# Patient Record
Sex: Male | Born: 2000 | Race: White | Hispanic: No | Marital: Single | Smoking: Never smoker
Health system: Southern US, Community
[De-identification: ages and names within clinical notes are randomized; demographics above are authoritative.]

## PROBLEM LIST (undated history)

## (undated) DIAGNOSIS — K219 Gastro-esophageal reflux disease without esophagitis: Secondary | ICD-10-CM

## (undated) DIAGNOSIS — J45909 Unspecified asthma, uncomplicated: Secondary | ICD-10-CM

## (undated) HISTORY — PX: FRACTURE SURGERY: SHX138

## (undated) HISTORY — PX: ORIF ANKLE FRACTURE: SUR919

## (undated) HISTORY — PX: MYRINGOTOMY: SUR874

---
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
## 2018-04-06 ENCOUNTER — Ambulatory Visit (INDEPENDENT_AMBULATORY_CARE_PROVIDER_SITE_OTHER): Payer: BLUE CROSS/BLUE SHIELD | Admitting: Family Medicine

## 2018-04-06 ENCOUNTER — Encounter: Payer: Self-pay | Admitting: Family Medicine

## 2018-04-06 VITALS — BP 100/64 | HR 57 | Temp 98.4°F | Ht 68.5 in | Wt 199.2 lb

## 2018-04-06 DIAGNOSIS — Z0000 Encounter for general adult medical examination without abnormal findings: Secondary | ICD-10-CM

## 2018-04-06 DIAGNOSIS — R5383 Other fatigue: Secondary | ICD-10-CM | POA: Diagnosis not present

## 2018-04-06 LAB — HEPATIC FUNCTION PANEL
ALK PHOS: 60 U/L (ref 52–171)
ALT: 32 U/L (ref 0–53)
AST: 30 U/L (ref 0–37)
Albumin: 4.5 g/dL (ref 3.5–5.2)
BILIRUBIN DIRECT: 0.2 mg/dL (ref 0.0–0.3)
TOTAL PROTEIN: 6.8 g/dL (ref 6.0–8.3)
Total Bilirubin: 0.9 mg/dL (ref 0.2–1.2)

## 2018-04-06 LAB — CBC WITH DIFFERENTIAL/PLATELET
BASOS ABS: 0 10*3/uL (ref 0.0–0.1)
BASOS PCT: 0.5 % (ref 0.0–3.0)
EOS ABS: 0.1 10*3/uL (ref 0.0–0.7)
Eosinophils Relative: 0.7 % (ref 0.0–5.0)
HEMATOCRIT: 42.9 % (ref 36.0–49.0)
Hemoglobin: 15 g/dL (ref 12.0–16.0)
LYMPHS PCT: 26 % (ref 24.0–48.0)
Lymphs Abs: 2.3 10*3/uL (ref 0.7–4.0)
MCHC: 35 g/dL (ref 31.0–37.0)
MCV: 92.8 fl (ref 78.0–98.0)
MONO ABS: 0.4 10*3/uL (ref 0.1–1.0)
Monocytes Relative: 4.6 % (ref 3.0–12.0)
Neutro Abs: 5.9 10*3/uL (ref 1.4–7.7)
Neutrophils Relative %: 68.2 % (ref 43.0–71.0)
Platelets: 220 10*3/uL (ref 150.0–575.0)
RBC: 4.63 Mil/uL (ref 3.80–5.70)
RDW: 12.4 % (ref 11.4–15.5)
WBC: 8.7 10*3/uL (ref 4.5–13.5)

## 2018-04-06 LAB — BASIC METABOLIC PANEL
BUN: 20 mg/dL (ref 6–23)
CO2: 29 mEq/L (ref 19–32)
CREATININE: 0.86 mg/dL (ref 0.40–1.50)
Calcium: 9.6 mg/dL (ref 8.4–10.5)
Chloride: 105 mEq/L (ref 96–112)
GFR: 121.62 mL/min (ref >60.00)
Glucose, Bld: 94 mg/dL (ref 70–99)
Potassium: 3.8 mEq/L (ref 3.5–5.1)
Sodium: 140 mEq/L (ref 135–145)

## 2018-04-06 LAB — LIPID PANEL
CHOL/HDL RATIO: 2
Cholesterol: 141 mg/dL (ref 0–200)
HDL: 58.7 mg/dL (ref >39.00)
LDL CALC: 75 mg/dL (ref 0–99)
NONHDL: 82.05
Triglycerides: 33 mg/dL (ref 0.0–149.0)
VLDL: 6.6 mg/dL (ref 0.0–40.0)

## 2018-04-06 LAB — TSH: TSH: 2.56 u[IU]/mL (ref 0.40–5.00)

## 2018-04-06 NOTE — Progress Notes (Signed)
Dr. Frederico Hamman T. Marcellina Jonsson, MD, [facility] Primary [facility] and Sports Medicine [place], [address] Phone: [phone] Fax: [phone]  04/06/2018  Patient: Ronald Estes, MRN: [id number], DOB: 09-07-2000, 17 y.o.  Primary Physician:  Owens Loffler, MD   Chief Complaint  Patient presents with  . Annual Exam   Subjective:   Ronald Estes is a 17 y.o. pleasant patient who presents with the following:  Preventative Health Maintenance Visit:  Health Maintenance Summary Reviewed and updated, unless pt declines services.  Tobacco History Reviewed. Alcohol: No concerns, no excessive use Exercise Habits: daily, high activity STD concerns: no risk or activity to increase risk Drug Use: None Encouraged self-testicular check  HPV vaccine #3  Down from 330 - 199 He and brother working out all the time. Wants to get down to 170.  Ketogenic diet.  Still some veggies.   Did want to do keep doing juul - trying to cut down  Health Maintenance  Topic Date Due  . HIV Screening  02/24/2014  . INFLUENZA VACCINE  03/31/2018  . TETANUS/TDAP  10/18/2019   Immunization History  Administered Date(s) Administered  . DTaP 05/08/1999, 09/10/1999, 10/27/1999, 05/31/2000, 04/10/2003  . HPV 9-valent 12/28/2016, 03/02/2017  . Hepatitis A 07/07/2006, 01/17/2007  . Hepatitis B 06/19/99, 04/04/1999, 12/08/1999  . HiB (PRP-OMP) 05/08/1999, 09/10/1999, 10/27/1999, 05/31/2000  . IPV 12/08/1999, 05/31/2000, 08/30/2000, 04/10/2003  . Influenza-Unspecified 09/04/2010, 08/21/2011, 07/19/2012  . MMR 03/04/2000, 04/10/2003  . Meningococcal B, OMV 12/28/2016  . Meningococcal Conjugate 10/17/2009, 12/28/2016  . Pneumococcal Conjugate-13 05/08/1999, 09/10/1999, 10/27/1999, 03/04/2000  . Tdap 10/17/2009  . Varicella 03/04/2000, 09/04/2010   There are no active problems to display for this patient.  Past Medical History:  Diagnosis Date  . Asthma   . GERD  (gastroesophageal reflux disease)   . Morbid obesity with body mass index of 40.0-49.9 ([place] [place]) 12/01/2013  . Obesity    Past Surgical History:  Procedure Laterality Date  . FRACTURE SURGERY    . HARDWARE REMOVAL  02/19/2012   Procedure: HARDWARE REMOVAL;  Surgeon: Alta Corning, MD;  Location: [facility];  Service: Orthopedics;  Laterality: Right;  right ankle  . MYRINGOTOMY     as infant  . ORIF ANKLE FRACTURE     rt 05-06-2011   Social History   Socioeconomic History  . Marital status: Single    Spouse name: Not on file  . Number of children: Not on file  . Years of education: Not on file  . Highest education level: Not on file  Occupational History  . Not on file  Social Needs  . Financial resource strain: Not on file  . Food insecurity:    Worry: Not on file    Inability: Not on file  . Transportation needs:    Medical: Not on file    Non-medical: Not on file  Tobacco Use  . Smoking status: Never Smoker  . Smokeless tobacco: Never Used  . Tobacco comment: dad smokes outside  Substance and Sexual Activity  . Alcohol use: No  . Drug use: No  . Sexual activity: Not on file  Lifestyle  . Physical activity:    Days per week: Not on file    Minutes per session: Not on file  . Stress: Not on file  Relationships  . Social connections:    Talks on phone: Not on file    Gets together: Not on file    Attends religious service: Not  on file    Active member of club or organization: Not on file    Attends meetings of clubs or organizations: Not on file    Relationship status: Not on file  . Intimate partner violence:    Fear of current or ex partner: Not on file    Emotionally abused: Not on file    Physically abused: Not on file    Forced sexual activity: Not on file  Other Topics Concern  . Not on file  Social History Narrative  . Not on file   History reviewed. No pertinent family history. No Known Allergies  Medication list has been reviewed and  updated.   General: Denies fever, chills, sweats. No significant weight loss. Eyes: Denies blurring,significant itching ENT: Denies earache, sore throat, and hoarseness. Cardiovascular: Denies chest pains, palpitations, dyspnea on exertion Respiratory: Denies cough, dyspnea at rest,wheeezing Breast: no concerns about lumps GI: Denies nausea, vomiting, diarrhea, constipation, change in bowel habits, abdominal pain, melena, hematochezia GU: Denies penile discharge, ED, urinary flow / outflow problems. No STD concerns. Musculoskeletal: Denies back pain, joint pain Derm: Denies rash, itching Neuro: Denies  paresthesias, frequent falls, frequent headaches Psych: Denies depression, anxiety Endocrine: Denies cold intolerance, heat intolerance, polydipsia Heme: Denies enlarged lymph nodes Allergy: No hayfever  Objective:   BP 100/64   Pulse (!) 57   Temp 98.4 F (36.9 C) (Oral)   Ht 5' 8.5" (1.74 m)   Wt 199 lb 4 oz (90.4 kg)   BMI 29.86 kg/m  Ideal Body Weight: Weight in (lb) to have BMI = 25: 166.5  No exam data present  GEN: well developed, well nourished, no acute distress Eyes: conjunctiva and lids normal, PERRLA, EOMI ENT: TM clear, nares clear, oral exam WNL Neck: supple, no lymphadenopathy, no thyromegaly, no JVD Pulm: clear to auscultation and percussion, respiratory effort normal CV: regular rate and rhythm, S1-S2, no murmur, rub or gallop, no bruits, peripheral pulses normal and symmetric, no cyanosis, clubbing, edema or varicosities GI: soft, non-tender; no hepatosplenomegaly, masses; active bowel sounds all quadrants GU: no hernia, testicular mass, penile discharge Lymph: no cervical, axillary or inguinal adenopathy MSK: gait normal, muscle tone and strength WNL, no joint swelling, effusions, discoloration, crepitus  SKIN: clear, good turgor, color WNL, no rashes, lesions, or ulcerations Neuro: normal mental status, normal strength, sensation, and motion Psych:  alert; oriented to person, place and time, normally interactive and not anxious or depressed in appearance.   Assessment and Plan:   Healthcare maintenance - Plan: Basic metabolic panel, CBC with Differential/Platelet, Hepatic function panel, Lipid panel, TSH  Other fatigue - Plan: Testos,Total,Free and SHBG (Male), TSH  130 pound remarkable weight loss - I congratulated him. Truly remarkable.   Health Maintenance Exam: The patient's preventative maintenance and recommended screening tests for an annual wellness exam were reviewed in full today. Brought up to date unless services declined.  Counselled on the importance of diet, exercise, and its role in overall health and mortality. The patient's FH and SH was reviewed, including their home life, tobacco status, and drug and alcohol status.  Follow-up in 1 year for physical exam or additional follow-up below.  Follow-up: No follow-ups on file. Or follow-up in 1 year if not noted.  Signed,  Maud Deed. Malek Skog, MD   Allergies as of 04/06/2018   No Known Allergies     Medication List    as of 04/06/2018 11:59 PM   You have not been prescribed any medications.

## 2018-04-08 ENCOUNTER — Encounter: Payer: Self-pay | Admitting: *Deleted

## 2018-04-12 LAB — TESTOS,TOTAL,FREE AND SHBG (FEMALE)
FREE TESTOSTERONE: 125.6 pg/mL (ref 35.0–155.0)
SEX HORMONE BINDING: 28 nmol/L (ref 10–50)
TESTOSTERONE, TOTAL, LC-MS-MS: 598 ng/dL (ref 250–1100)

## 2018-05-10 DIAGNOSIS — L986 Other infiltrative disorders of the skin and subcutaneous tissue: Secondary | ICD-10-CM | POA: Diagnosis not present

## 2018-06-08 ENCOUNTER — Ambulatory Visit: Payer: BLUE CROSS/BLUE SHIELD | Admitting: Family Medicine

## 2018-06-08 ENCOUNTER — Encounter: Payer: Self-pay | Admitting: Family Medicine

## 2018-06-08 VITALS — BP 90/60 | HR 68 | Temp 97.6°F | Ht 68.5 in | Wt 194.5 lb

## 2018-06-08 DIAGNOSIS — N62 Hypertrophy of breast: Secondary | ICD-10-CM

## 2018-06-08 DIAGNOSIS — N63 Unspecified lump in unspecified breast: Secondary | ICD-10-CM

## 2018-06-08 LAB — HCG, QUANTITATIVE, PREGNANCY: QUANTITATIVE HCG: 0.17 m[IU]/mL

## 2018-06-08 NOTE — Progress Notes (Signed)
Dr. Karleen Hampshire T. Gerlad Pelzel, MD, CAQ [facility] [address] [place], [address] Phone: [phone] Fax: [phone]  06/08/2018  Patient: Ronald Estes, MRN: [id number], DOB: 2001/02/15, 17 y.o.  Primary Physician:  Hannah Beat, MD   Chief Complaint  Patient presents with  . Sore Area on Chest    also requesting lab work   Subjective:   Ronald Estes is a 16 y.o. very pleasant male patient who presents with the following:  Having some tenderness - lump underneath both nipples. Did not notice it until trying to lose weight. He has lost 140 pounds with diet and exercise, and these areas have become more noticeable as he has lost weight.   There is a lump on both sides underneath the nipple.  Non-tender, no discharge.   Past Medical History, Surgical History, Social History, Family History, Problem List, Medications, and Allergies have been reviewed and updated if relevant.  There are no active problems to display for this patient.   Past Medical History:  Diagnosis Date  . Asthma   . GERD (gastroesophageal reflux disease)   . Morbid obesity with body mass index of 40.0-49.9 (HCC) 12/01/2013  . Obesity     Past Surgical History:  Procedure Laterality Date  . FRACTURE SURGERY    . HARDWARE REMOVAL  02/19/2012   Procedure: HARDWARE REMOVAL;  Surgeon: Harvie Junior, MD;  Location: [facility];  Service: Orthopedics;  Laterality: Right;  right ankle  . MYRINGOTOMY     as infant  . ORIF ANKLE FRACTURE     rt 05-06-2011    Social History   Socioeconomic History  . Marital status: Single    Spouse name: Not on file  . Number of children: Not on file  . Years of education: Not on file  . Highest education level: Not on file  Occupational History  . Not on file  Social Needs  . Financial resource strain: Not on file  . Food insecurity:    Worry: Not on file    Inability: Not on file  . Transportation  needs:    Medical: Not on file    Non-medical: Not on file  Tobacco Use  . Smoking status: Never Smoker  . Smokeless tobacco: Never Used  . Tobacco comment: dad smokes outside  Substance and Sexual Activity  . Alcohol use: No  . Drug use: No  . Sexual activity: Not on file  Lifestyle  . Physical activity:    Days per week: Not on file    Minutes per session: Not on file  . Stress: Not on file  Relationships  . Social connections:    Talks on phone: Not on file    Gets together: Not on file    Attends religious service: Not on file    Active member of club or organization: Not on file    Attends meetings of clubs or organizations: Not on file    Relationship status: Not on file  . Intimate partner violence:    Fear of current or ex partner: Not on file    Emotionally abused: Not on file    Physically abused: Not on file    Forced sexual activity: Not on file  Other Topics Concern  . Not on file  Social History Narrative  . Not on file    History reviewed. No pertinent family history.  No Known Allergies  Medication list reviewed and updated in  full in [facility].   GEN: No acute illnesses, no fevers, chills. GI: No n/v/d, eating normally Pulm: No SOB Interactive and getting along well at home.  Otherwise, ROS is as per the HPI.  Objective:   BP 90/60   Pulse 68   Temp 97.6 F (36.4 C) (Oral)   Ht 5' 8.5" (1.74 m)   Wt 194 lb 8 oz (88.2 kg)   BMI 29.14 kg/m   GEN: WDWN, NAD, Non-toxic, A & O x 3 HEENT: Atraumatic, Normocephalic. Neck supple. No masses, No LAD. Ears and Nose: No external deformity. CV: RRR, No M/G/R. No JVD. No thrill. No extra heart sounds. PULM: CTA B, no wheezes, crackles, rhonchi. No retractions. No resp. distress. No accessory muscle use. EXTR: No c/c/e NEURO Normal gait.  PSYCH: Normally interactive. Conversant. Not depressed or anxious appearing.  Calm demeanor.   Chest: Good pec development with approx 2-3 area of mobile  tissue underneath nipples B that his freely mobile and circumscribed and rubbery in feel.  Laboratory and Imaging Data: Results for orders placed or performed in visit on 04/06/18  Basic metabolic panel  Result Value Ref Range   Sodium 140 135 - 145 mEq/L   Potassium 3.8 3.5 - 5.1 mEq/L   Chloride 105 96 - 112 mEq/L   CO2 29 19 - 32 mEq/L   Glucose, Bld 94 70 - 99 mg/dL   BUN 20 6 - 23 mg/dL   Creatinine, Ser 1.61 0.40 - 1.50 mg/dL   Calcium 9.6 8.4 - 09.6 mg/dL   GFR 045.40 >98.11 mL/min  CBC with Differential/Platelet  Result Value Ref Range   WBC 8.7 4.5 - 13.5 K/uL   RBC 4.63 3.80 - 5.70 Mil/uL   Hemoglobin 15.0 12.0 - 16.0 g/dL   HCT 91.4 78.2 - 95.6 %   MCV 92.8 78.0 - 98.0 fl   MCHC 35.0 31.0 - 37.0 g/dL   RDW 21.3 08.6 - 57.8 %   Platelets 220.0 150.0 - 575.0 K/uL   Neutrophils Relative % 68.2 43.0 - 71.0 %   Lymphocytes Relative 26.0 24.0 - 48.0 %   Monocytes Relative 4.6 3.0 - 12.0 %   Eosinophils Relative 0.7 0.0 - 5.0 %   Basophils Relative 0.5 0.0 - 3.0 %   Neutro Abs 5.9 1.4 - 7.7 K/uL   Lymphs Abs 2.3 0.7 - 4.0 K/uL   Monocytes Absolute 0.4 0.1 - 1.0 K/uL   Eosinophils Absolute 0.1 0.0 - 0.7 K/uL   Basophils Absolute 0.0 0.0 - 0.1 K/uL  Hepatic function panel  Result Value Ref Range   Total Bilirubin 0.9 0.2 - 1.2 mg/dL   Bilirubin, Direct 0.2 0.0 - 0.3 mg/dL   Alkaline Phosphatase 60 52 - 171 U/L   AST 30 0 - 37 U/L   ALT 32 0 - 53 U/L   Total Protein 6.8 6.0 - 8.3 g/dL   Albumin 4.5 3.5 - 5.2 g/dL  Lipid panel  Result Value Ref Range   Cholesterol 141 0 - 200 mg/dL   Triglycerides 46.9 0.0 - 149.0 mg/dL   HDL 62.95 >28.41 mg/dL   VLDL 6.6 0.0 - 32.4 mg/dL   LDL Cholesterol 75 0 - 99 mg/dL   Total CHOL/HDL Ratio 2    NonHDL 82.05   Testos,Total,Free and SHBG (Male)  Result Value Ref Range   Testosterone, Total, LC-MS-MS 598 250 - 1,100 ng/dL   Free Testosterone 401.0 35.0 - 155.0 pg/mL   Sex Hormone Binding 28 10 -  50 nmol/L  TSH  Result  Value Ref Range   TSH 2.56 0.40 - 5.00 uIU/mL     Assessment and Plan:   Gynecomastia - Plan: hCG, quantitative, pregnancy  Breast mass in male - Plan: FSH/LH, Estradiol, hCG, quantitative, pregnancy, CANCELED: Beta hCG quant (ref lab)  Pretty classic exam and history for gynecomastia with normal testosterone and TSH. Check additional endocrine labs.   If normal, I will refer him to plastic surgery.  Follow-up: No follow-ups on file.  Orders Placed This Encounter  Procedures  . FSH/LH  . Estradiol  . hCG, quantitative, pregnancy    Signed,  Meghna Hagmann T. Jazzmon Prindle, MD   Allergies as of 06/08/2018   No Known Allergies     Medication List    as of 06/08/2018  1:51 PM   You have not been prescribed any medications.

## 2018-06-09 ENCOUNTER — Telehealth: Payer: Self-pay | Admitting: Family Medicine

## 2018-06-09 LAB — FSH/LH
FSH: 3.7 m[IU]/mL (ref 1.6–8.0)
LH: 3.8 m[IU]/mL (ref 1.5–9.3)

## 2018-06-09 LAB — ESTRADIOL: ESTRADIOL: 27 pg/mL (ref <=39)

## 2018-06-09 NOTE — Telephone Encounter (Signed)
Copied from CRM [id number]. Topic: Quick Communication - See Telephone Encounter >> Jun 09, 2018 12:35 PM Terisa Starr wrote: CRM for notification. See Telephone encounter for: 06/09/18.  Patient would like to have his lab results from yesterday [phone], he said he did just miss a call on the other line. Thanks

## 2018-06-09 NOTE — Telephone Encounter (Signed)
Spoke to pt and informed him of results. States he is interested in referral but he is wanting to speak with his parents first, and will contact office back to confirm and provide preference of location

## 2018-06-10 NOTE — Telephone Encounter (Signed)
Patient's preference of location will be [facility] at [facility].   Dr. Salli Quarry  Fax - [phone] Attn:Crystal Phone- [phone] & [phone]

## 2018-06-15 ENCOUNTER — Other Ambulatory Visit: Payer: Self-pay | Admitting: Family Medicine

## 2018-06-15 DIAGNOSIS — N62 Hypertrophy of breast: Secondary | ICD-10-CM

## 2018-06-15 NOTE — Telephone Encounter (Signed)
Please apologize to Ronald Estes.   I did not get his message until today - it seems to have been a secretarial error.   I have put in his referral for him. Good luck.

## 2018-06-15 NOTE — Telephone Encounter (Signed)
Patient calling to check on status of referral? Would like a call once completed as well.

## 2018-06-15 NOTE — Telephone Encounter (Signed)
Spoke with Jorja Loa (dad) and relayed below message from Dr. Patsy Lager.

## 2018-06-16 NOTE — Telephone Encounter (Signed)
Spoke with Pennie Rushing, Referral has been faxed to [facility] to see Dr Jerrye Noble, patient will receive a call from their office after records are reviewed.

## 2018-06-29 ENCOUNTER — Encounter: Payer: Self-pay | Admitting: Family Medicine

## 2018-06-29 DIAGNOSIS — N62 Hypertrophy of breast: Secondary | ICD-10-CM | POA: Diagnosis not present

## 2018-08-03 DIAGNOSIS — N62 Hypertrophy of breast: Secondary | ICD-10-CM | POA: Diagnosis not present

## 2018-08-04 DIAGNOSIS — R112 Nausea with vomiting, unspecified: Secondary | ICD-10-CM | POA: Diagnosis not present

## 2018-08-04 DIAGNOSIS — Z9889 Other specified postprocedural states: Secondary | ICD-10-CM | POA: Diagnosis not present

## 2018-08-04 DIAGNOSIS — N62 Hypertrophy of breast: Secondary | ICD-10-CM | POA: Diagnosis not present

## 2018-08-12 DIAGNOSIS — N6092 Unspecified benign mammary dysplasia of left breast: Secondary | ICD-10-CM | POA: Diagnosis not present

## 2018-08-12 DIAGNOSIS — N62 Hypertrophy of breast: Secondary | ICD-10-CM | POA: Diagnosis not present

## 2018-08-12 DIAGNOSIS — L304 Erythema intertrigo: Secondary | ICD-10-CM | POA: Diagnosis not present

## 2018-08-12 DIAGNOSIS — N6091 Unspecified benign mammary dysplasia of right breast: Secondary | ICD-10-CM | POA: Diagnosis not present

## 2018-08-22 DIAGNOSIS — N62 Hypertrophy of breast: Secondary | ICD-10-CM | POA: Diagnosis not present

## 2018-09-27 DIAGNOSIS — N62 Hypertrophy of breast: Secondary | ICD-10-CM | POA: Diagnosis not present

## 2020-03-11 ENCOUNTER — Telehealth: Payer: Self-pay | Admitting: Family Medicine

## 2020-03-11 NOTE — Telephone Encounter (Signed)
Patient has not been seen here in our office since 2019.  He will need an office visit with Dr. Patsy Lager or he can get it done at a pharmacy.

## 2020-03-11 NOTE — Telephone Encounter (Signed)
Pt called wanting to schedule a nurse visit for tetanus  He stated he is due for one and started new job.  Ok to schedule  Would like do Friday am if possible

## 2020-03-12 NOTE — Telephone Encounter (Signed)
Spoke with pt he is aware of donna's comments and will go to pharmacy

## 2024-07-21 ENCOUNTER — Emergency Department
Admission: EM | Admit: 2024-07-21 | Discharge: 2024-07-22 | Disposition: A | Discharge status: Home / Self Care | Attending: Emergency Medicine | Admitting: Emergency Medicine

## 2024-07-21 ENCOUNTER — Other Ambulatory Visit: Payer: Self-pay

## 2024-07-21 DIAGNOSIS — H5712 Ocular pain, left eye: Secondary | ICD-10-CM | POA: Diagnosis present

## 2024-07-21 NOTE — ED Triage Notes (Signed)
 Patient ambulatory to triage with complaints of possible metal in left eye. Patient states he was shaving steel and had something hit him in the eye. Eye appears red. Patient denies vision changes at this time.

## 2024-07-22 MED ORDER — TETRACAINE HCL 0.5 % OP SOLN
2.0000 [drp] | Freq: Once | OPHTHALMIC | Status: AC
  Administered 2024-07-22: 2 [drp] via OPHTHALMIC
  Filled 2024-07-22: qty 4

## 2024-07-22 MED ORDER — FLUORESCEIN SODIUM 1 MG OP STRP
1.0000 | ORAL_STRIP | Freq: Once | OPHTHALMIC | Status: AC
  Administered 2024-07-22: 1 via OPHTHALMIC
  Filled 2024-07-22: qty 1

## 2024-07-22 NOTE — ED Provider Notes (Signed)
 [facility] Provider Note    Event Date/Time   First MD Initiated Contact with Patient 07/21/24 2358     (approximate)   History   Eye Pain   HPI Ronald Estes is a 23 y.o. male who presents for foreign body sensation and pain in his left eye.  He said that he was doing a project earlier today that involved grinding metal and cutting some plastic.  He was wearing safety glasses but after he was done he realized that his vision seemed a little bit decreased in his left eye and he was having pain like something was stuck in his eye.  He looked in his eye at 1 point I thought he could see something but he was not sure.  He tried flushing it and it did not make a difference.  He has no eye problems at baseline and does not wear contacts or glasses normally.  He says that after he got to the emergency department, while he was waiting in the waiting room, he rubbed his eye and a small white piece of what he thinks was plastic came out and now his eye feels better.     Physical Exam   Triage Vital Signs: ED Triage Vitals  Encounter Vitals Group     BP 07/21/24 2159 113/82     Girls Systolic BP Percentile --      Girls Diastolic BP Percentile --      Boys Systolic BP Percentile --      Boys Diastolic BP Percentile --      Pulse Rate 07/21/24 2159 69     Resp 07/21/24 2159 17     Temp 07/21/24 2159 98.1 F (36.7 C)     Temp Source 07/21/24 2159 Oral     SpO2 07/21/24 2159 98 %     Weight 07/21/24 2200 113.4 kg (250 lb)     Height 07/21/24 2200 1.778 m (5' 10)     Head Circumference --      Peak Flow --      Pain Score 07/21/24 2209 8     Pain Loc --      Pain Education --      Exclude from Growth Chart --     Most recent vital signs: Vitals:   07/21/24 2159 07/22/24 0120  BP: 113/82 116/82  Pulse: 69 72  Resp: 17 18  Temp: 98.1 F (36.7 C) 98.1 F (36.7 C)  SpO2: 98% 98%    General: Awake, no distress.  Eyes:  Normal and equal appearance  of both eyes.  Pupils are equal and reactive.  Examination after tetracaine  and fluorescein  of the left eye reveals no corneal abrasions nor any other injury to the surface of the eye.  Under UV and normal light and with magnification from the [facility], I can appreciate no evidence of any foreign material in the eye or evidence of globe injury even with extremes of gaze directions and attempts at everting the upper eyelid. CV:  Good peripheral perfusion.  Resp:  Normal effort. Speaking easily and comfortably, no accessory muscle usage nor intercostal retractions.   Abd:  No distention.    ED Results / Procedures / Treatments   Labs (all labs ordered are listed, but only abnormal results are displayed) Labs Reviewed - No data to display    PROCEDURES:  Critical Care performed: No  Procedures    IMPRESSION / MDM / ASSESSMENT AND PLAN / ED COURSE  I reviewed the triage vital signs and the nursing notes.                              Differential diagnosis includes, but is not limited to, corneal abrasion, corneal ulceration, foreign body, globe injury, conjunctivitis, uveitis.  Patient's presentation is most consistent with acute presentation with potential threat to life or bodily function.  Interventions/Medications given:  Medications  fluorescein  ophthalmic strip 1 strip (1 strip Left Eye Given 07/22/24 0035)  tetracaine  (PONTOCAINE) 0.5 % ophthalmic solution 2 drop (2 drops Right Eye Given 07/22/24 0034)    (Note:  hospital course my include additional interventions and/or labs/studies not listed above.)   Reassuring physical exam with no evidence of infection and no foreign material or corneal abrasion or other acute injury.  Patient thinks he got a piece of plastic out earlier which is very possible.  He did not engage in any welding or other activity that involves strong UV light so photokeratitis is very unlikely.  I advised the patient to use saline drops and  follow-up with [facility] on Monday.  He understands and agrees with the plan.     FINAL CLINICAL IMPRESSION(S) / ED DIAGNOSES   Final diagnoses:  Left eye pain     Rx / DC Orders   ED Discharge Orders     None        Note:  This document was prepared using Dragon voice recognition software and may include unintentional dictation errors.   Gordan Huxley, MD 07/22/24 [phone]

## 2024-07-22 NOTE — Discharge Instructions (Signed)
 As we discussed, if you had a foreign body in your eye, it appears that you got it out.  Your eye exam was otherwise reassuring with no evidence that you have a corneal abrasion or infection.  Please use saline wetting drops on your eye (nothing that says get the red out) over the weekend.  If you are still having any discomfort or if you develop worsening symptoms, please call the [facility] on Monday morning and explain you were seen over the weekend in the emergency department and need a follow-up appointment.  They will usually work you in on the same day, either with Dr. Enola or one of his colleagues.
